# Patient Record
Sex: Male | Born: 1960
Health system: Southern US, Community
[De-identification: ages and names within clinical notes are randomized; demographics above are authoritative.]

## PROBLEM LIST (undated history)

## (undated) DIAGNOSIS — I10 Essential (primary) hypertension: Secondary | ICD-10-CM

---

## 1999-02-23 ENCOUNTER — Ambulatory Visit (HOSPITAL_COMMUNITY): Admission: RE | Admit: 1999-02-23 | Discharge: 1999-02-23 | Payer: Self-pay | Admitting: Gastroenterology

## 2012-03-17 ENCOUNTER — Emergency Department (HOSPITAL_BASED_OUTPATIENT_CLINIC_OR_DEPARTMENT_OTHER): Payer: Self-pay

## 2012-03-17 ENCOUNTER — Emergency Department (HOSPITAL_BASED_OUTPATIENT_CLINIC_OR_DEPARTMENT_OTHER)
Admission: EM | Admit: 2012-03-17 | Discharge: 2012-03-17 | Disposition: A | Discharge status: Home / Self Care | Payer: Self-pay | Attending: Emergency Medicine | Admitting: Emergency Medicine

## 2012-03-17 ENCOUNTER — Encounter (HOSPITAL_BASED_OUTPATIENT_CLINIC_OR_DEPARTMENT_OTHER): Payer: Self-pay | Admitting: *Deleted

## 2012-03-17 DIAGNOSIS — Z79899 Other long term (current) drug therapy: Secondary | ICD-10-CM | POA: Insufficient documentation

## 2012-03-17 DIAGNOSIS — R079 Chest pain, unspecified: Secondary | ICD-10-CM | POA: Insufficient documentation

## 2012-03-17 DIAGNOSIS — I10 Essential (primary) hypertension: Secondary | ICD-10-CM | POA: Insufficient documentation

## 2012-03-17 DIAGNOSIS — R252 Cramp and spasm: Secondary | ICD-10-CM

## 2012-03-17 HISTORY — DX: Essential (primary) hypertension: I10

## 2012-03-17 LAB — CBC WITH DIFFERENTIAL/PLATELET
Basophils Relative: 1 % (ref 0–1)
Eosinophils Absolute: 0 10*3/uL (ref 0.0–0.7)
HCT: 43.2 % (ref 39.0–52.0)
Hemoglobin: 14.7 g/dL (ref 13.0–17.0)
Lymphs Abs: 1.8 10*3/uL (ref 0.7–4.0)
MCH: 29.8 pg (ref 26.0–34.0)
MCHC: 34 g/dL (ref 30.0–36.0)
Monocytes Absolute: 0.5 10*3/uL (ref 0.1–1.0)
Monocytes Relative: 9 % (ref 3–12)
Neutro Abs: 3.7 10*3/uL (ref 1.7–7.7)
RBC: 4.94 MIL/uL (ref 4.22–5.81)

## 2012-03-17 LAB — PROTIME-INR: INR: 1.04 (ref 0.00–1.49)

## 2012-03-17 LAB — APTT: aPTT: 32 seconds (ref 24–37)

## 2012-03-17 LAB — BASIC METABOLIC PANEL
BUN: 20 mg/dL (ref 6–23)
CO2: 27 mEq/L (ref 19–32)
Calcium: 9.5 mg/dL (ref 8.4–10.5)
Chloride: 101 mEq/L (ref 96–112)
Creatinine, Ser: 1.3 mg/dL (ref 0.50–1.35)
GFR calc Af Amer: 73 mL/min — ABNORMAL LOW (ref >90)
GFR calc non Af Amer: 63 mL/min — ABNORMAL LOW (ref >90)
Glucose, Bld: 141 mg/dL — ABNORMAL HIGH (ref 70–99)
Potassium: 3.6 mEq/L (ref 3.5–5.1)
Sodium: 138 mEq/L (ref 135–145)

## 2012-03-17 LAB — TROPONIN I: Troponin I: <0.3 ng/mL (ref <0.30)

## 2012-03-17 MED ORDER — ASPIRIN 81 MG PO CHEW
CHEWABLE_TABLET | ORAL | Status: AC
  Administered 2012-03-17: 324 mg via ORAL
  Filled 2012-03-17: qty 4

## 2012-03-17 MED ORDER — ASPIRIN 81 MG PO CHEW
324.0000 mg | CHEWABLE_TABLET | Freq: Once | ORAL | Status: AC
  Administered 2012-03-17: 324 mg via ORAL

## 2012-03-17 NOTE — ED Notes (Addendum)
Pt states he was driving Friday and began experiencing CP (worse with deep inspiration) Lasted 1/2-1 hr. Today at restaurant began having CP again. Right hand balled up and could not release. No pain at present. Taken to ED1 and EKG being done.

## 2012-03-17 NOTE — ED Notes (Signed)
Patient states that he has no CP and no SOB while here

## 2012-03-17 NOTE — ED Provider Notes (Signed)
History  This chart was scribed for Cody B. Bernette Mayers, MD by Shari Heritage. The patient was seen in room MH01/MH01. Patient's care was started at 1648.    CSN: [id number]  Arrival date & time 03/17/12  1648   First MD Initiated Contact with Patient 03/17/12 1653      Chief Complaint  Patient presents with  . Chest Pain    The history is provided by the patient. No language interpreter was used.   BION Cody Sandoval is a 51 y.o. male who presents to the Emergency Department complaining of episodic, moderate chest pain onset 2 days ago. Patient describes the pain as non-radiating and says that it was worse with deep inspiration. His first episode of chest pain was on Friday and it lasted for a couple of hours. The pain has not recurred and there is no chest pain at this time. Patient states he had a right hand cramp that began several hours ago while patient was eating at a restaurant. The episode lasted 5 minutes and resolved spontaneously. He said that his right hand balled up and he could not release it. He is still reporting difficulty forming a fist with the same hand. Patient denies any SOB now or during chest pain episode. No numbness of upper or lower extremities. Patient has a medical history of HTN. His family history includes diabetes (father). He was tested 1 year ago for diabetes without a positive diagnosis. Patient does not smoke. He drinks socially. He does not use any drugs. Patient has had no major surgeries. Patient has never smoked.  PCP - Parke Simmers  Past Medical History  Diagnosis Date  . Hypertension    Family History  Problem Relation Age of Onset  . Diabetes Father      History  Substance Use Topics  . Smoking status: Never Smoker   . Smokeless tobacco: Not on file  . Alcohol Use: Yes      Review of Systems A complete 10 system review of systems was obtained and all systems are negative except as noted in the HPI and PMH.   Allergies  Review of patient's  allergies indicates not on file.  Home Medications   Current Outpatient Rx  Name Route Sig Dispense Refill  . ALISKIREN-HYDROCHLOROTHIAZIDE 150-12.5 MG PO TABS Oral Take 1 tablet by mouth.    Marland Kitchen LISINOPRIL 2.5 MG PO TABS Oral Take 2.5 mg by mouth daily.    . NEBIVOLOL HCL 10 MG PO TABS Oral Take 10 mg by mouth daily.      BP 108/70  Pulse 78  Temp 98.1 F (36.7 C) (Oral)  Resp 20  Ht 5' 10.5" (1.791 m)  Wt 170 lb (77.111 kg)  BMI 24.05 kg/m2  SpO2 98%  Physical Exam  Nursing note and vitals reviewed. Constitutional: He is oriented to person, place, and time. He appears well-developed and well-nourished.  HENT:  Head: Normocephalic and atraumatic.  Eyes: EOM are normal. Pupils are equal, round, and reactive to light.  Neck: Normal range of motion. Neck supple.  Cardiovascular: Normal rate, normal heart sounds and intact distal pulses.   Pulmonary/Chest: Effort normal and breath sounds normal.  Abdominal: Bowel sounds are normal. He exhibits no distension. There is no tenderness.  Musculoskeletal: Normal range of motion. He exhibits no edema and no tenderness.  Neurological: He is alert and oriented to person, place, and time. He has normal strength. No cranial nerve deficit or sensory deficit.  Skin: Skin is warm and dry. No  rash noted.  Psychiatric: He has a normal mood and affect.    ED Course  Procedures (including critical care time) DIAGNOSTIC STUDIES: Oxygen Saturation is 98% on room air, normal by my interpretation.    COORDINATION OF CARE: 4:59pm- Patient informed of current plan for treatment and evaluation and agrees with plan at this time.  Results for orders placed during the hospital encounter of 03/17/12  CBC WITH DIFFERENTIAL      Component Value Range   WBC 6.0  4.0 - 10.5 K/uL   RBC 4.94  4.22 - 5.81 MIL/uL   Hemoglobin 14.7  13.0 - 17.0 g/dL   HCT 16.1  09.6 - 04.5 %   MCV 87.4  78.0 - 100.0 fL   MCH 29.8  26.0 - 34.0 pg   MCHC 34.0  30.0 - 36.0  g/dL   RDW 40.9  81.1 - 91.4 %   Platelets 150  150 - 400 K/uL   Neutrophils Relative 61  43 - 77 %   Neutro Abs 3.7  1.7 - 7.7 K/uL   Lymphocytes Relative 30  12 - 46 %   Lymphs Abs 1.8  0.7 - 4.0 K/uL   Monocytes Relative 9  3 - 12 %   Monocytes Absolute 0.5  0.1 - 1.0 K/uL   Eosinophils Relative 0  0 - 5 %   Eosinophils Absolute 0.0  0.0 - 0.7 K/uL   Basophils Relative 1  0 - 1 %   Basophils Absolute 0.0  0.0 - 0.1 K/uL  BASIC METABOLIC PANEL      Component Value Range   Sodium 138  135 - 145 mEq/L   Potassium 3.6  3.5 - 5.1 mEq/L   Chloride 101  96 - 112 mEq/L   CO2 27  19 - 32 mEq/L   Glucose, Bld 141 (*) 70 - 99 mg/dL   BUN 20  6 - 23 mg/dL   Creatinine, Ser 7.82  0.50 - 1.35 mg/dL   Calcium 9.5  8.4 - 95.6 mg/dL   GFR calc non Af Amer 63 (*) >90 mL/min   GFR calc Af Amer 73 (*) >90 mL/min  TROPONIN I      Component Value Range   Troponin I <0.30  <0.30 ng/mL  PROTIME-INR      Component Value Range   Prothrombin Time 13.8  11.6 - 15.2 seconds   INR 1.04  0.00 - 1.49  APTT      Component Value Range   aPTT 32  24 - 37 seconds    Dg Chest 2 View  03/17/2012  *RADIOLOGY REPORT*  Clinical Data: Chest pain  CHEST - 2 VIEW  Comparison:  None.  Findings:  The heart size and mediastinal contours are within normal limits.  Both lungs are clear.  The visualized skeletal structures are unremarkable.  IMPRESSION: No active cardiopulmonary disease.  Original Report Authenticated By: Camelia Phenes, M.D.     No diagnosis found.    MDM   Date: 03/17/2012  Rate: 84  Rhythm: normal sinus rhythm  QRS Axis: normal  Intervals: normal  ST/T Wave abnormalities: nonspecific ST/T changes  Conduction Disutrbances:none  Narrative Interpretation: inferior ST elevation is likely early repolarization, no reciprocal changes Old EKG Reviewed: none available  Pt not having active chest pain, last episode was 2 days ago, HTN is his only risk factor. Symptoms today of right hand cramp  not consistent with any cardiac/anginal type symptoms.   Reviewed EKG with Dr. Sherilyn Cooter  Smith on call for cardiology. He agrees with assessment that this is unlikely to be ischemic given lack of pain. Could be early pericarditis, but labs and imaging are otherwise normal. Pt has PCP, Dr. Parke Simmers. Doubt that this is ACS given all these factors. Advised to followup with PCP in 1-2 days for further evaluation and to return to the ED immediately if symptoms return.      I personally performed the services described in the documentation, which were scribed in my presence. The recorded information has been reviewed and considered.     Cody B. Bernette Mayers, MD 03/17/12 2019

## 2013-09-24 IMAGING — CR DG CHEST 2V
2 series · 2 of 2 positions shown · non-contrast
Comparison: None.

CLINICAL DATA: Chest pain

CHEST - 2 VIEW

[w chest pa]
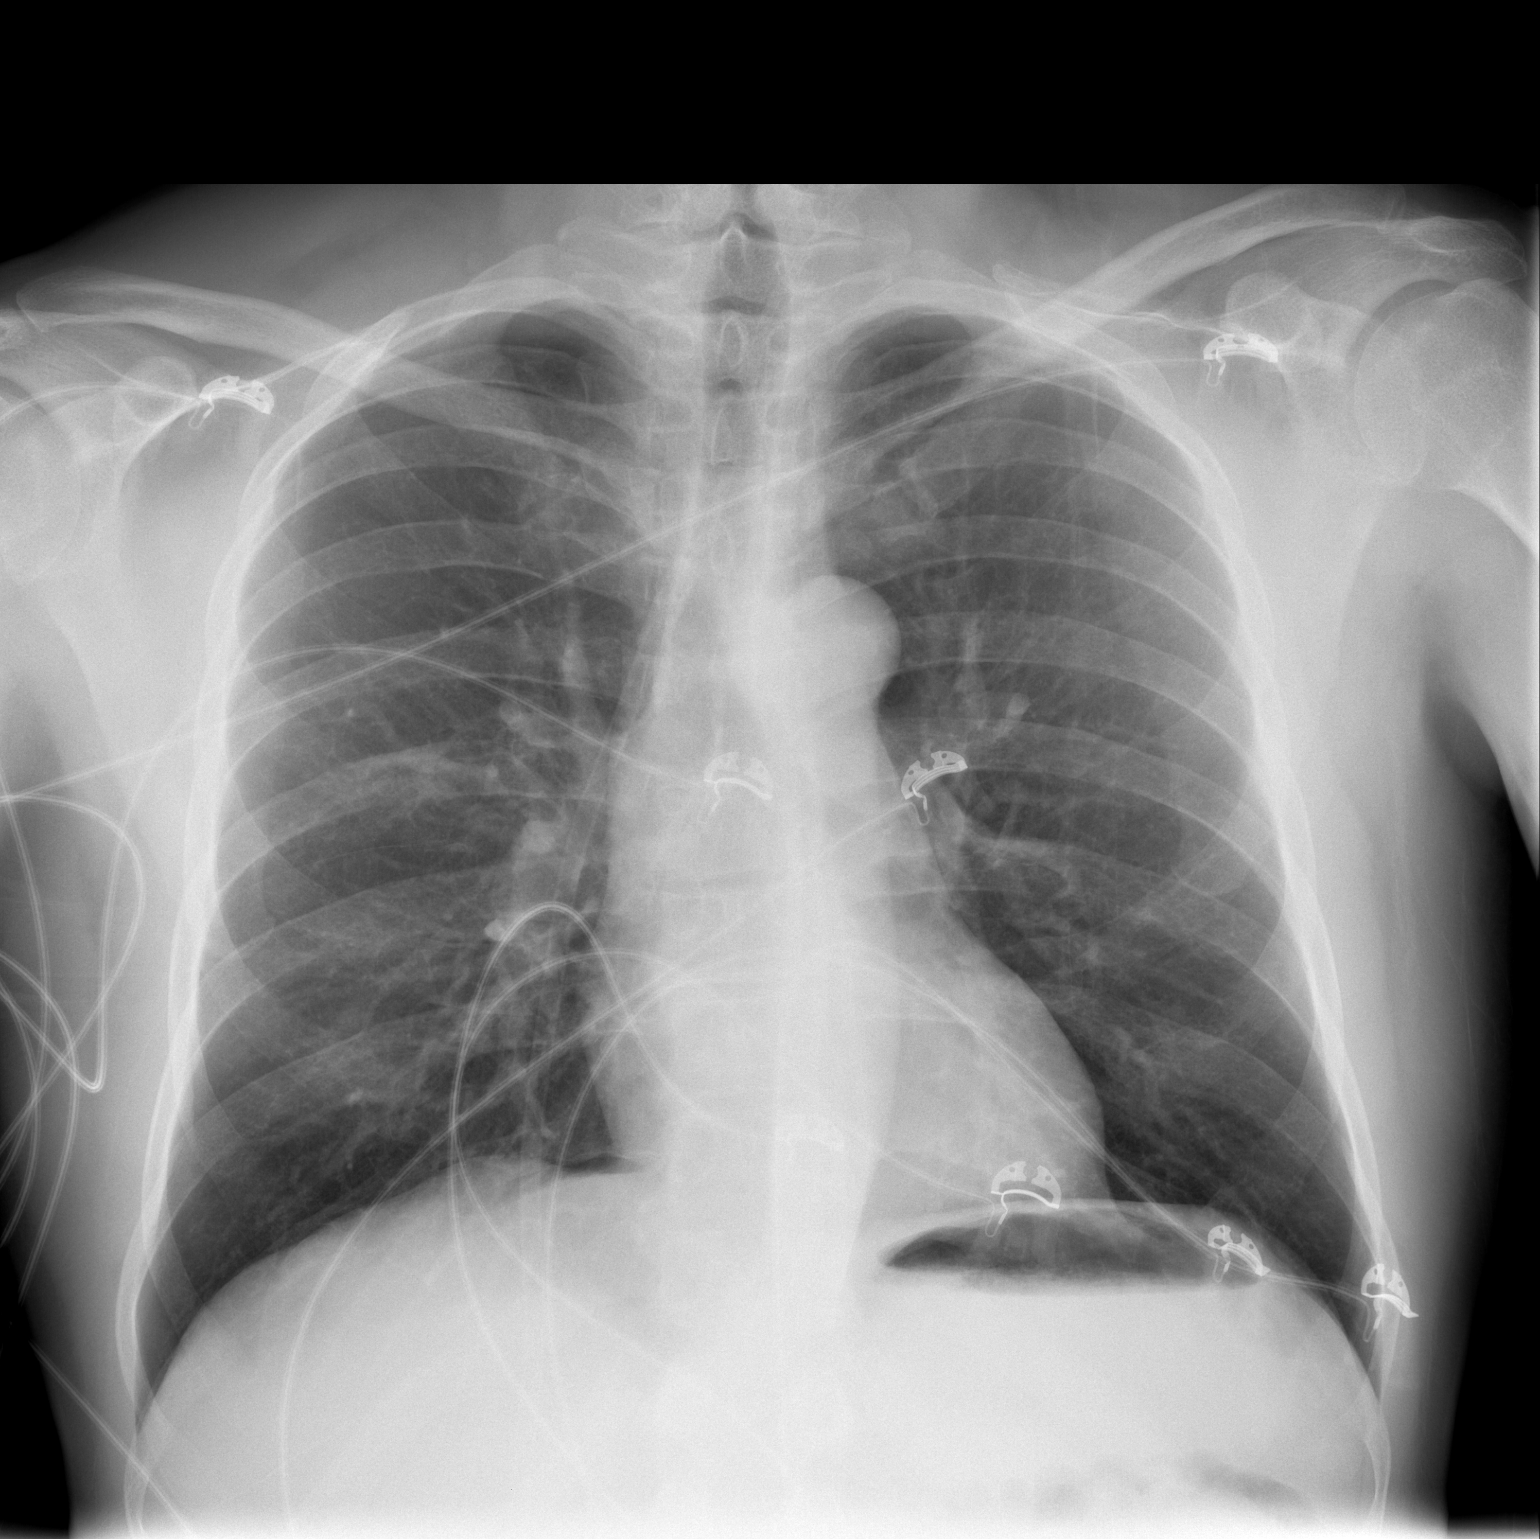

[w chest lat]
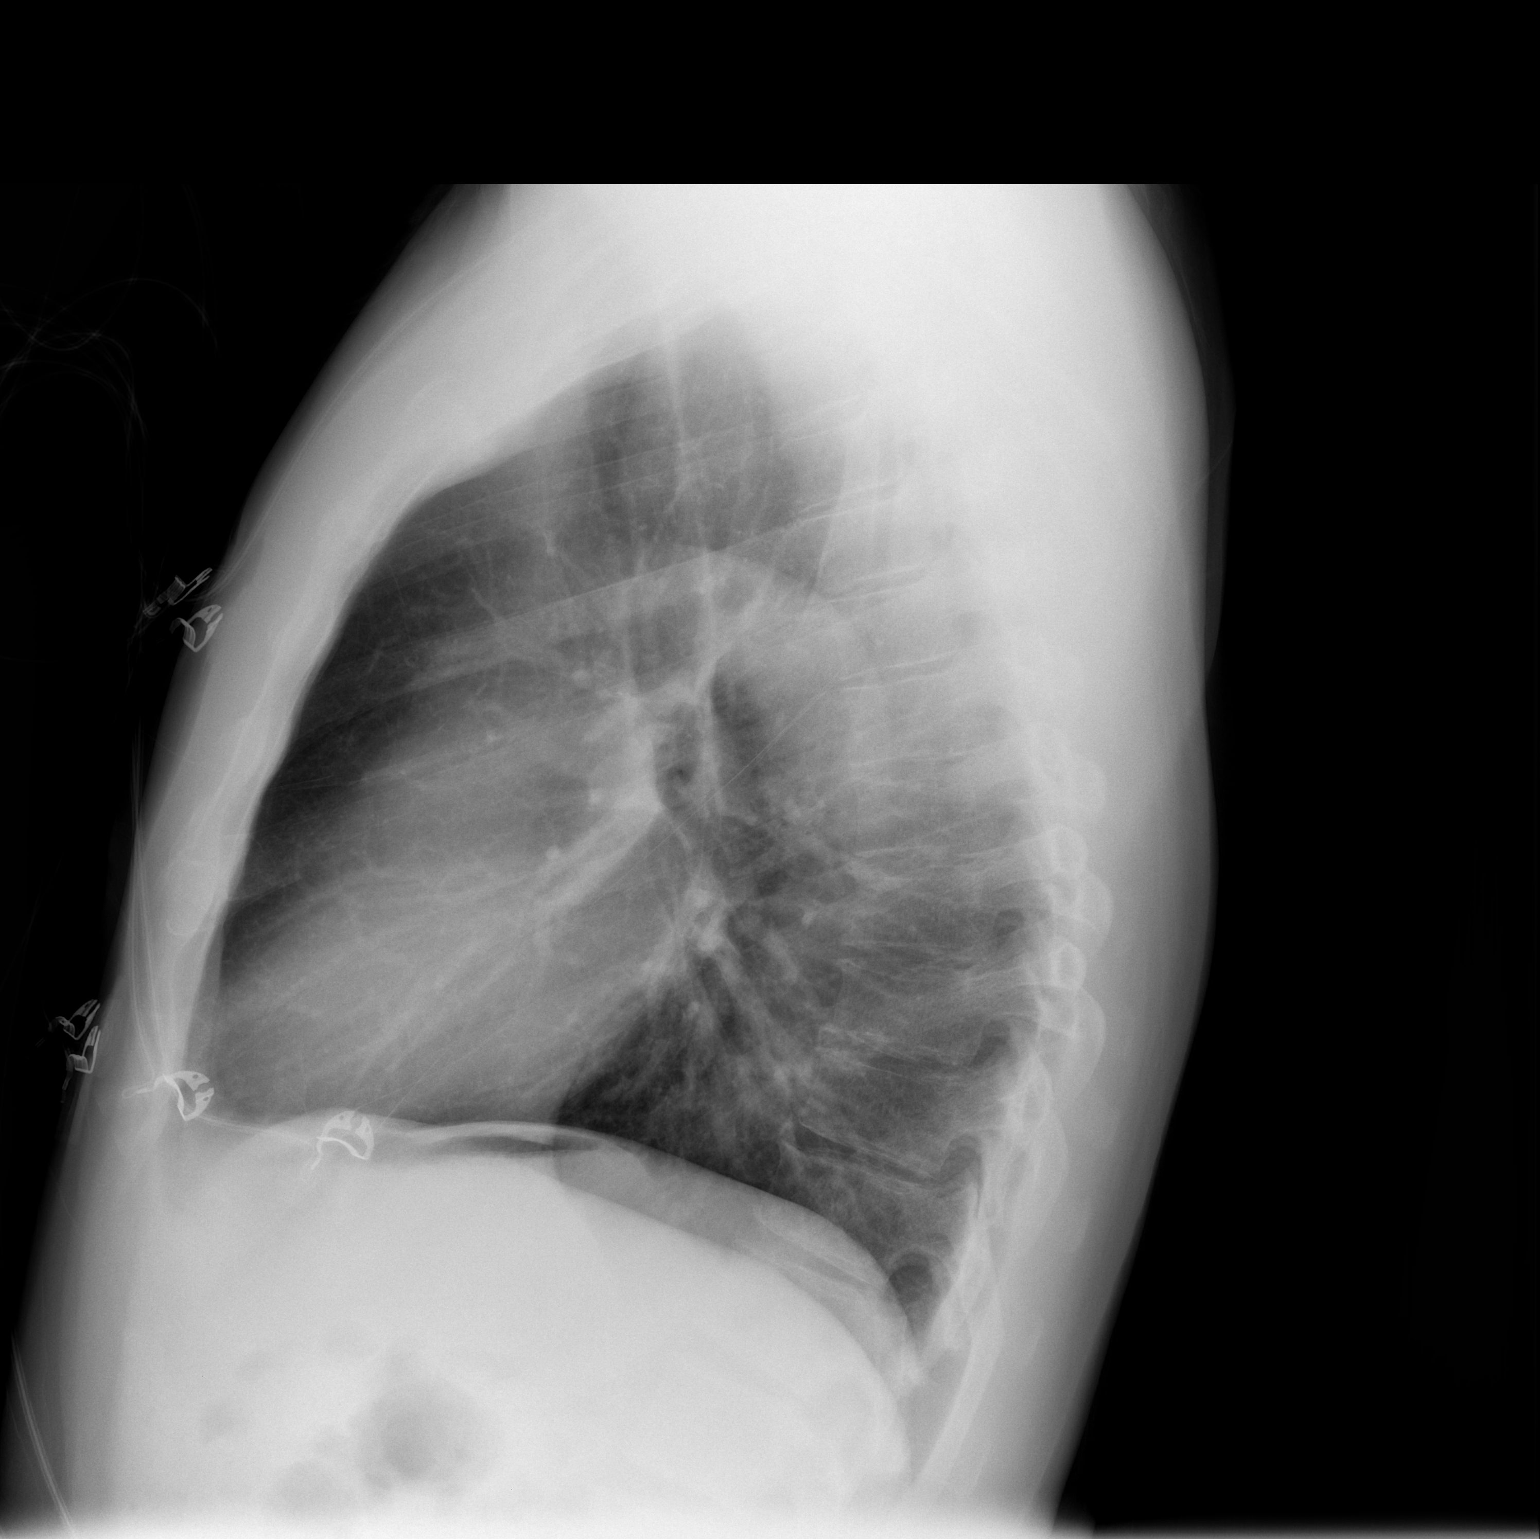

[2 of 2 positions shown; findings below may reference images not displayed]

FINDINGS: The heart size and mediastinal contours are within
normal limits.  Both lungs are clear.  The visualized skeletal
structures are unremarkable.
IMPRESSION: No active cardiopulmonary disease.

## 2014-10-16 ENCOUNTER — Emergency Department (HOSPITAL_COMMUNITY)
Admission: EM | Admit: 2014-10-16 | Discharge: 2014-10-16 | Disposition: A | Discharge status: Home / Self Care | Payer: Managed Care, Other (non HMO) | Attending: Emergency Medicine | Admitting: Emergency Medicine

## 2014-10-16 ENCOUNTER — Emergency Department (HOSPITAL_COMMUNITY): Payer: Managed Care, Other (non HMO)

## 2014-10-16 ENCOUNTER — Encounter (HOSPITAL_COMMUNITY): Payer: Self-pay | Admitting: Emergency Medicine

## 2014-10-16 DIAGNOSIS — I10 Essential (primary) hypertension: Secondary | ICD-10-CM | POA: Diagnosis not present

## 2014-10-16 DIAGNOSIS — R070 Pain in throat: Secondary | ICD-10-CM | POA: Insufficient documentation

## 2014-10-16 DIAGNOSIS — R059 Cough, unspecified: Secondary | ICD-10-CM

## 2014-10-16 DIAGNOSIS — R319 Hematuria, unspecified: Secondary | ICD-10-CM | POA: Diagnosis not present

## 2014-10-16 DIAGNOSIS — R05 Cough: Secondary | ICD-10-CM | POA: Insufficient documentation

## 2014-10-16 DIAGNOSIS — Z79899 Other long term (current) drug therapy: Secondary | ICD-10-CM | POA: Diagnosis not present

## 2014-10-16 LAB — URINALYSIS, ROUTINE W REFLEX MICROSCOPIC
BILIRUBIN URINE: NEGATIVE
Glucose, UA: NEGATIVE mg/dL
Ketones, ur: NEGATIVE mg/dL
Leukocytes, UA: NEGATIVE
Nitrite: NEGATIVE
PH: 7.5 (ref 5.0–8.0)
Protein, ur: 30 mg/dL — AB
Specific Gravity, Urine: 1.009 (ref 1.005–1.030)
Urobilinogen, UA: 0.2 mg/dL (ref 0.0–1.0)

## 2014-10-16 LAB — URINE MICROSCOPIC-ADD ON

## 2014-10-16 MED ORDER — ACETAMINOPHEN 500 MG PO TABS
1000.0000 mg | ORAL_TABLET | Freq: Once | ORAL | Status: AC
  Administered 2014-10-16: 1000 mg via ORAL
  Filled 2014-10-16: qty 2

## 2014-10-16 MED ORDER — AZITHROMYCIN 250 MG PO TABS: 250.0000 mg | ORAL_TABLET | Freq: Every day | ORAL | Status: AC

## 2014-10-16 MED ORDER — CETIRIZINE-PSEUDOEPHEDRINE ER 5-120 MG PO TB12: 1.0000 | ORAL_TABLET | Freq: Every day | ORAL | Status: AC

## 2014-10-16 NOTE — ED Notes (Signed)
Entered room to DC patient home Patient informed several times of testing results and that he needs to f/u with [facility] as specified in the DC instructions Patient stated that he wants to speak with Dr. Hyacinth Sandoval again prior to completing DC process EDP made aware and is currently at bedside

## 2014-10-16 NOTE — ED Notes (Signed)

## 2014-10-16 NOTE — Discharge Instructions (Signed)
Your testing showed blood without infection - you MUST follow up this week - call for appointment at the [facility] office - see attached contact information.

## 2014-10-16 NOTE — ED Provider Notes (Signed)
CSN: [id number]     Arrival date & time 10/16/14  1809 History   First MD Initiated Contact with Patient 10/16/14 2130     Chief Complaint  Patient presents with  . Hematuria  . Allergies  . Throat burning   . Cough     (Consider location/radiation/quality/duration/timing/severity/associated sxs/prior Treatment) HPI Comments: The patient is a 54 year old male, he has a history of seasonal allergies and hypertension, he presents because of having hematuria. He states that currently he is having hematuria for the last 3 months almost every time he urinates, he has had this intermittently in the past and in fact has had a cystoscopy at the [facility] 2 years ago which she reports was unremarkable. He denies any urinary retention, he denies fevers chills nausea or vomiting though he does report that he has some coughing associated with seasonal allergies. Nothing makes his hematuria better or worse, no recent urinary manipulation, denies any penile or scrotal pain  Patient is a 54 y.o. male presenting with hematuria and cough. The history is provided by the patient.  Hematuria  Cough   Past Medical History  Diagnosis Date  . Hypertension    History reviewed. No pertinent past surgical history. Family History  Problem Relation Age of Onset  . Diabetes Father    History  Substance Use Topics  . Smoking status: Never Smoker   . Smokeless tobacco: Not on file  . Alcohol Use: Yes    Review of Systems  Respiratory: Positive for cough.   Genitourinary: Positive for hematuria.  All other systems reviewed and are negative.     Allergies  Review of patient's allergies indicates no known allergies.  Home Medications   Prior to Admission medications   Medication Sig Start Date End Date Taking? Authorizing Provider  Aliskiren-Hydrochlorothiazide 150-12.5 MG TABS Take 1 tablet by mouth daily.    Yes Historical Provider, MD  Misc Natural Products  (MULTI-VEGETABLE PO) Take 1 scoop by mouth daily.   Yes Historical Provider, MD  Multiple Vitamin (MULTIVITAMIN WITH MINERALS) TABS tablet Take 1 tablet by mouth daily.   Yes Historical Provider, MD  nebivolol (BYSTOLIC) 10 MG tablet Take 10 mg by mouth daily.   Yes Historical Provider, MD  Whey Protein POWD Take by mouth daily.   Yes Historical Provider, MD  azithromycin (ZITHROMAX Z-PAK) 250 MG tablet Take 1 tablet (250 mg total) by mouth daily.  PO day 1, then  PO days 205 10/16/14   Eber Hong, MD  cetirizine-pseudoephedrine (ZYRTEC-D) 5-120 MG per tablet Take 1 tablet by mouth daily. 10/16/14   Eber Hong, MD  lisinopril (PRINIVIL,ZESTRIL) 2.5 MG tablet Take 2.5 mg by mouth daily.    Historical Provider, MD   BP 134/84 mmHg  Pulse 66  Temp(Src) 98.6 F (37 C) (Oral)  Resp 20  SpO2 100% Physical Exam  Constitutional: He appears well-developed and well-nourished. No distress.  HENT:  Head: Normocephalic and atraumatic.  Mouth/Throat: Oropharynx is clear and moist. No oropharyngeal exudate.  Oropharynx is clear and moist, nasal passages clear  Eyes: Conjunctivae and EOM are normal. Pupils are equal, round, and reactive to light. Right eye exhibits no discharge. Left eye exhibits no discharge. No scleral icterus.  Neck: Normal range of motion. Neck supple. No JVD present. No thyromegaly present.  Cardiovascular: Normal rate, regular rhythm, normal heart sounds and intact distal pulses.  Exam reveals no gallop and no friction rub.   No murmur heard. Pulmonary/Chest: Effort normal and breath sounds normal.  No respiratory distress. He has no wheezes. He has no rales.  Lungs very clear, phonation normal  Abdominal: Soft. Bowel sounds are normal. He exhibits no distension and no mass. There is no tenderness.  Genitourinary:  Normal appearing penis and testicles, no blood at the urethral meatus  Musculoskeletal: Normal range of motion. He exhibits no edema or tenderness.   Lymphadenopathy:    He has no cervical adenopathy.  Neurological: He is alert. Coordination normal.  Skin: Skin is warm and dry. No rash noted. No erythema.  Psychiatric: He has a normal mood and affect. His behavior is normal.  Nursing note and vitals reviewed.   ED Course  Procedures (including critical care time) Labs Review Labs Reviewed  URINALYSIS, ROUTINE W REFLEX MICROSCOPIC - Abnormal; Notable for the following:    Color, Urine RED (*)    APPearance CLOUDY (*)    Hgb urine dipstick LARGE (*)    Protein, ur 30 (*)    All other components within normal limits  URINE CULTURE  URINE MICROSCOPIC-ADD ON    Imaging Review No results found.    MDM   Final diagnoses:  Cough  Hematuria    Vital signs are totally unremarkable, lungs are very clear, likely has seasonal allergies, the etiology of his hematuria is unclear, at this time it would be worth getting a urinalysis and a culture though I doubt this is infectious given the length of symptoms. Will need referral to local urology for repeat cystoscopy though. He does not take any blood thinners, no history of coagulopathy.  Will treat for allergies - have strongly recommended to the pt that he need f/u with Urology - have stressed that cancer is possible etiology - pt will be d/c home.  He is in agreement with plan.   Eber HongBrian Liridona Mashaw, MD 10/18/14 [phone]

## 2014-10-16 NOTE — ED Notes (Signed)
Pt c/o blood in urine (problematic for 6+ months) but has not sought medical advice. Also c/o allergies recently. Has had a "hacking cough" and congestion. Taking Vitamin C herbal supplement from [facility] [employer] [facility]. Denies dizziness. Reports "random headaches" which are more frequent recently-taking Tylenol instead of Ibuprofen d/t HTN medication. Pt RR even/unlabored. No other c/c.

## 2014-10-18 LAB — URINE CULTURE
COLONY COUNT: NO GROWTH
CULTURE: NO GROWTH

## 2015-09-24 ENCOUNTER — Telehealth: Payer: Self-pay | Admitting: Emergency Medicine

## 2015-09-25 NOTE — Telephone Encounter (Signed)
I spoke with patient. Will transition to Xanax for acute episodes. We'll stop his Valium. He has no suicidal thoughts.
